# Patient Record
Sex: Male | Born: 2000 | Race: Black or African American | Hispanic: No | Marital: Single
Health system: Southern US, Community
[De-identification: ages and names within clinical notes are randomized; demographics above are authoritative.]

## PROBLEM LIST (undated history)

## (undated) DIAGNOSIS — J45909 Unspecified asthma, uncomplicated: Secondary | ICD-10-CM

## (undated) DIAGNOSIS — R059 Cough, unspecified: Secondary | ICD-10-CM

---
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
## 2020-03-30 ENCOUNTER — Ambulatory Visit: Payer: Self-pay | Attending: Internal Medicine

## 2020-03-30 DIAGNOSIS — Z23 Encounter for immunization: Secondary | ICD-10-CM

## 2020-03-30 NOTE — Progress Notes (Signed)
   Covid-19 Vaccination Clinic  Name:  Javier Nelson    MRN: [id number] DOB: 09-Dec-2000  03/30/2020  Javier Nelson was observed post Covid-19 immunization for 15 minutes without incident. He was provided with Vaccine Information Sheet and instruction to access the V-Safe system.   Javier Nelson was instructed to call 911 with any severe reactions post vaccine: Marland Kitchen Difficulty breathing  . Swelling of face and throat  . A fast heartbeat  . A bad rash all over body  . Dizziness and weakness   Immunizations Administered    Name Date Dose VIS Date Route   PFIZER Comrnaty(Gray TOP) Covid-19 Vaccine 03/30/2020  3:45 PM 0.3 mL 01/22/2020 Intramuscular   Manufacturer: [employer], [employer]   Lot: [id number]   NDC: [phone]

## 2020-05-31 ENCOUNTER — Other Ambulatory Visit: Payer: Self-pay

## 2020-05-31 ENCOUNTER — Emergency Department (HOSPITAL_COMMUNITY)

## 2020-05-31 ENCOUNTER — Emergency Department (HOSPITAL_COMMUNITY)
Admission: EM | Admit: 2020-05-31 | Discharge: 2020-05-31 | Disposition: A | Discharge status: Home / Self Care | Attending: Emergency Medicine | Admitting: Emergency Medicine

## 2020-05-31 ENCOUNTER — Encounter (HOSPITAL_COMMUNITY): Payer: Self-pay | Admitting: Emergency Medicine

## 2020-05-31 DIAGNOSIS — Z20822 Contact with and (suspected) exposure to covid-19: Secondary | ICD-10-CM | POA: Insufficient documentation

## 2020-05-31 DIAGNOSIS — J45909 Unspecified asthma, uncomplicated: Secondary | ICD-10-CM | POA: Insufficient documentation

## 2020-05-31 DIAGNOSIS — J101 Influenza due to other identified influenza virus with other respiratory manifestations: Secondary | ICD-10-CM | POA: Diagnosis not present

## 2020-05-31 DIAGNOSIS — R059 Cough, unspecified: Secondary | ICD-10-CM | POA: Diagnosis present

## 2020-05-31 HISTORY — DX: Unspecified asthma, uncomplicated: J45.909

## 2020-05-31 LAB — RESP PANEL BY RT-PCR (FLU A&B, COVID) ARPGX2
Influenza A by PCR: POSITIVE — AB
Influenza B by PCR: NEGATIVE
SARS Coronavirus 2 by RT PCR: NEGATIVE

## 2020-05-31 MED ORDER — ALBUTEROL SULFATE HFA 108 (90 BASE) MCG/ACT IN AERS: 2.0000 | INHALATION_SPRAY | Freq: Once | RESPIRATORY_TRACT | Status: DC

## 2020-05-31 NOTE — ED Triage Notes (Signed)
Pt c/o cough, runny nose, sore throat and head aches x 2 days. Pt requesting covid testing.

## 2020-05-31 NOTE — ED Triage Notes (Signed)
Emergency Medicine Provider Triage Evaluation Note  Javier Nelson , a 20 y.o. male  was evaluated in triage.  Pt complains of cough, ha, sore throat. Denies fever. Asking for refill of albuterol  Review of Systems  Positive: Rhinorrhea, cough, sore throat, headaches Negative: fever  Physical Exam  BP 130/82 (BP Location: Right Arm)   Pulse 96   Temp 98.3 F (36.8 C) (Oral)   Resp (!) 22   SpO2 97%  Gen:   Awake, no distress   HEENT:  Atraumatic, no tonsillar exudate Resp:  Normal effort  Cardiac:  Normal rate  Abd:   Nondistended, nontender  MSK:   Moves extremities without difficulty  Neuro:  Speech clear   Medical Decision Making  Medically screening exam initiated at 8:09 PM.  Appropriate orders placed.  Javier Nelson was informed that the remainder of the evaluation will be completed by another provider, this initial triage assessment does not replace that evaluation, and the importance of remaining in the ED until their evaluation is complete.  Clinical Impression   20 y/o M presenting with URI sxs.   MSE was initiated and I personally evaluated the patient and placed orders (if any) at  8:09 PM on May 31, 2020.  The patient appears stable so that the remainder of the MSE may be completed by another provider.    Karrie Meres, [place] 05/31/20 2009

## 2020-05-31 NOTE — Discharge Instructions (Signed)
Tested positive for influenza A

## 2020-05-31 NOTE — ED Notes (Signed)
RN reviewed discharge instruction w/ pt. Follow up care and pain management reviewed, pt had no further questions

## 2020-05-31 NOTE — ED Provider Notes (Signed)
[facility] EMERGENCY DEPARTMENT Provider Note   CSN: [id number] Arrival date & time: 05/31/20  1820     History Chief Complaint  Patient presents with  . Cough    Javier Nelson is a 20 y.o. male.  The history is provided by the patient.  Cough Cough characteristics:  Non-productive Sputum characteristics:  Nondescript Severity:  Moderate Onset quality:  Gradual Timing:  Intermittent Progression:  Waxing and waning Chronicity:  New Relieved by:  Nothing Worsened by:  Nothing Associated symptoms: no chest pain, no fever, no shortness of breath and no sore throat        Past Medical History:  Diagnosis Date  . Asthma     There are no problems to display for this patient.   History reviewed. No pertinent surgical history.     No family history on file.  Social History   Substance Use Topics  . Alcohol use: Never  . Drug use: Never    Home Medications Prior to Admission medications   Not on File    Allergies    Amoxicillin  Review of Systems   Review of Systems  Constitutional: Negative for fever.  HENT: Negative for congestion and sore throat.   Respiratory: Positive for cough. Negative for shortness of breath.   Cardiovascular: Negative for chest pain.    Physical Exam Updated Vital Signs BP 139/89 (BP Location: Right Arm)   Pulse 89   Temp 100.1 F (37.8 C) (Oral)   Resp 20   Ht 5\' 10"  (1.778 m)   Wt 59 kg   SpO2 99%   BMI 18.65 kg/m   Physical Exam Constitutional:      General: He is not in acute distress. HENT:     Head: Normocephalic.     Nose: Nose normal.     Mouth/Throat:     Mouth: Mucous membranes are moist.  Cardiovascular:     Pulses: Normal pulses.  Pulmonary:     Effort: Pulmonary effort is normal.  Neurological:     General: No focal deficit present.     Mental Status: He is alert.     ED Results / Procedures / Treatments   Labs (all labs ordered are listed, but only abnormal results  are displayed) Labs Reviewed  RESP PANEL BY RT-PCR (FLU A&B, COVID) ARPGX2 - Abnormal; Notable for the following components:      Result Value   Influenza A by PCR POSITIVE (*)    All other components within normal limits    EKG None  Radiology DG Chest 2 View  Result Date: 05/31/2020 CLINICAL DATA:  Cough, nasal congestion EXAM: CHEST - 2 VIEW COMPARISON:  None. FINDINGS: The heart size and mediastinal contours are within normal limits. Both lungs are clear. The visualized skeletal structures are unremarkable. IMPRESSION: Negative. Electronically Signed   By: 06/02/2020 M.D.   On: 05/31/2020 21:23    Procedures Procedures   Medications Ordered in ED Medications  albuterol (VENTOLIN HFA) 108 (90 Base) MCG/ACT inhaler 2 puff (has no administration in time range)    ED Course  I have reviewed the triage vital signs and the nursing notes.  Pertinent labs & imaging results that were available during my care of the patient were reviewed by me and considered in my medical decision making (see chart for details).    MDM Rules/Calculators/A&P  Javier Nelson is here with cough.  Chest x-ray negative for pneumonia.  Influenza A is positive.  Normal vitals.  No respiratory distress.  Overall very well-appearing.  Discharged in good condition.  Understands return precautions.  About 2 to 3 days into symptoms.  Decided against Tamiflu.  This chart was dictated using voice recognition software.  Despite best efforts to proofread,  errors can occur which can change the documentation meaning.   Final Clinical Impression(s) / ED Diagnoses Final diagnoses:  Influenza A    Rx / DC Orders ED Discharge Orders    None       Virgina Norfolk, DO 05/31/20 2232

## 2022-03-26 IMAGING — CR DG CHEST 2V
2 series · 2 of 2 positions shown · non-contrast
Comparison: None.

CLINICAL DATA: Cough, nasal congestion

EXAM:
CHEST - 2 VIEW

[chest pa]
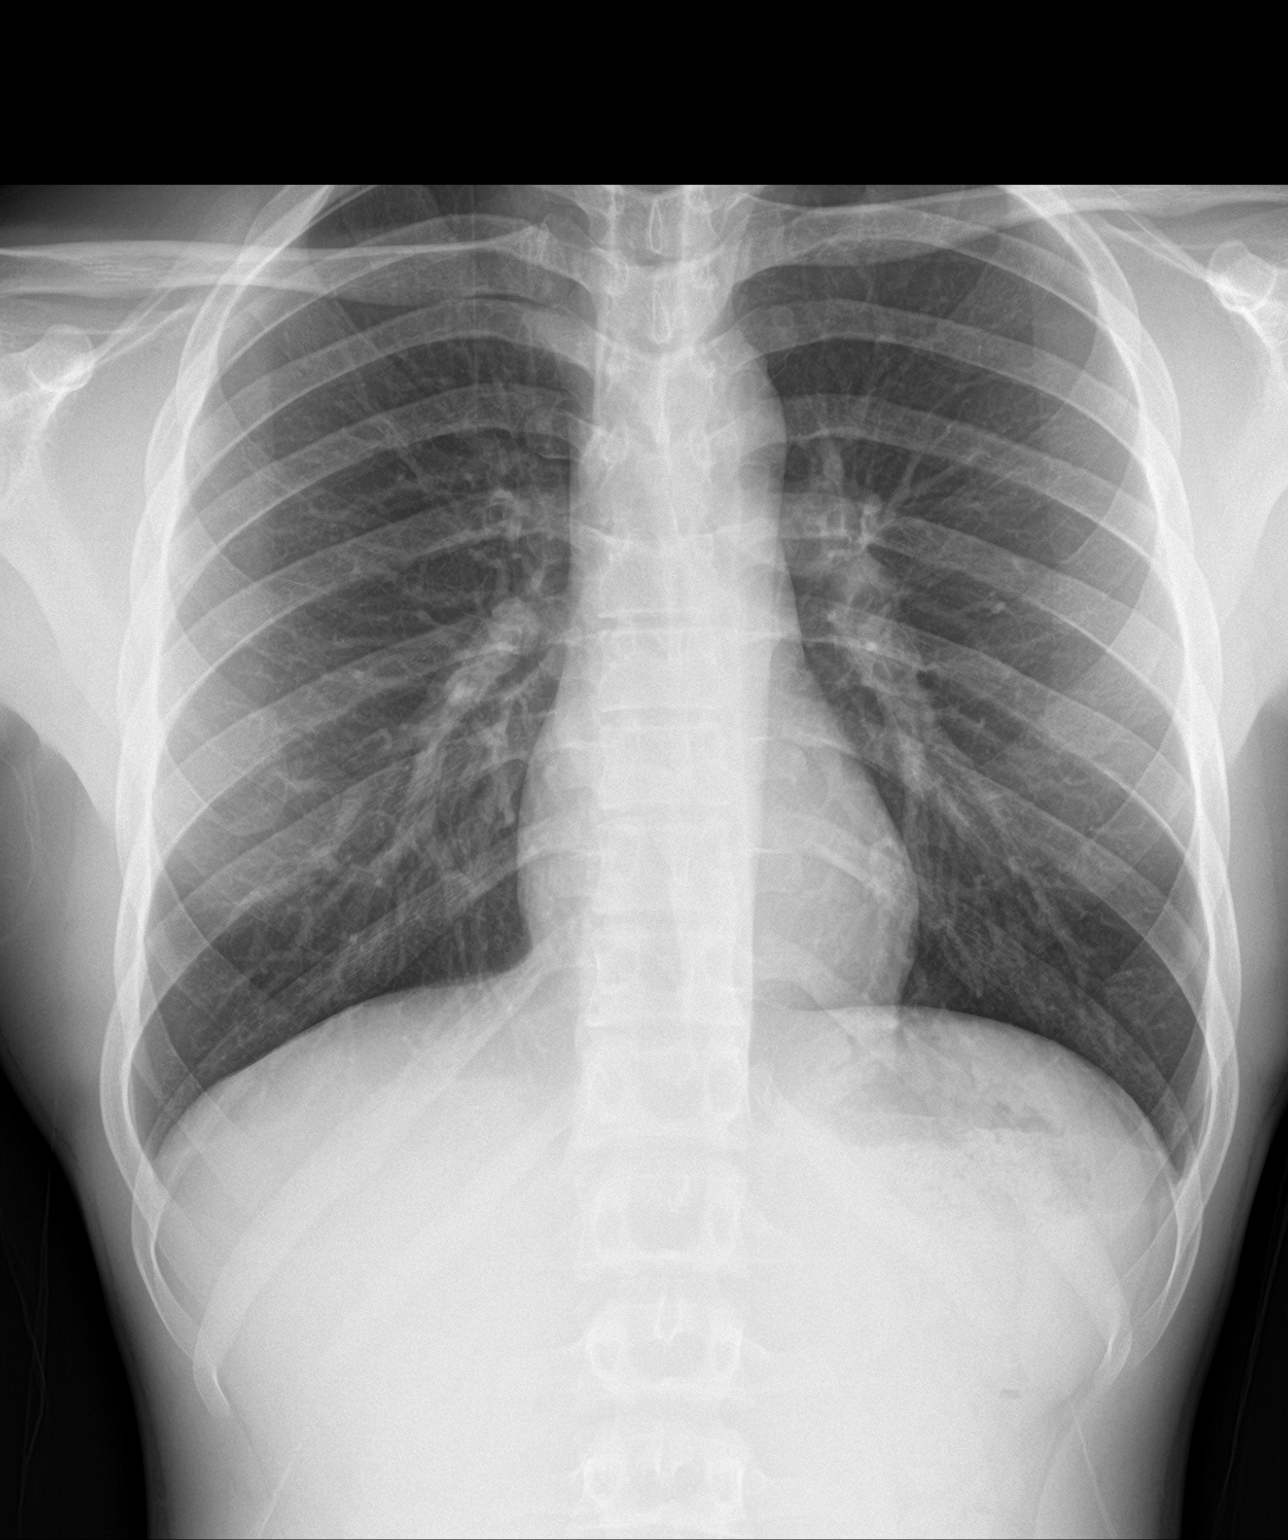

[chest lat]
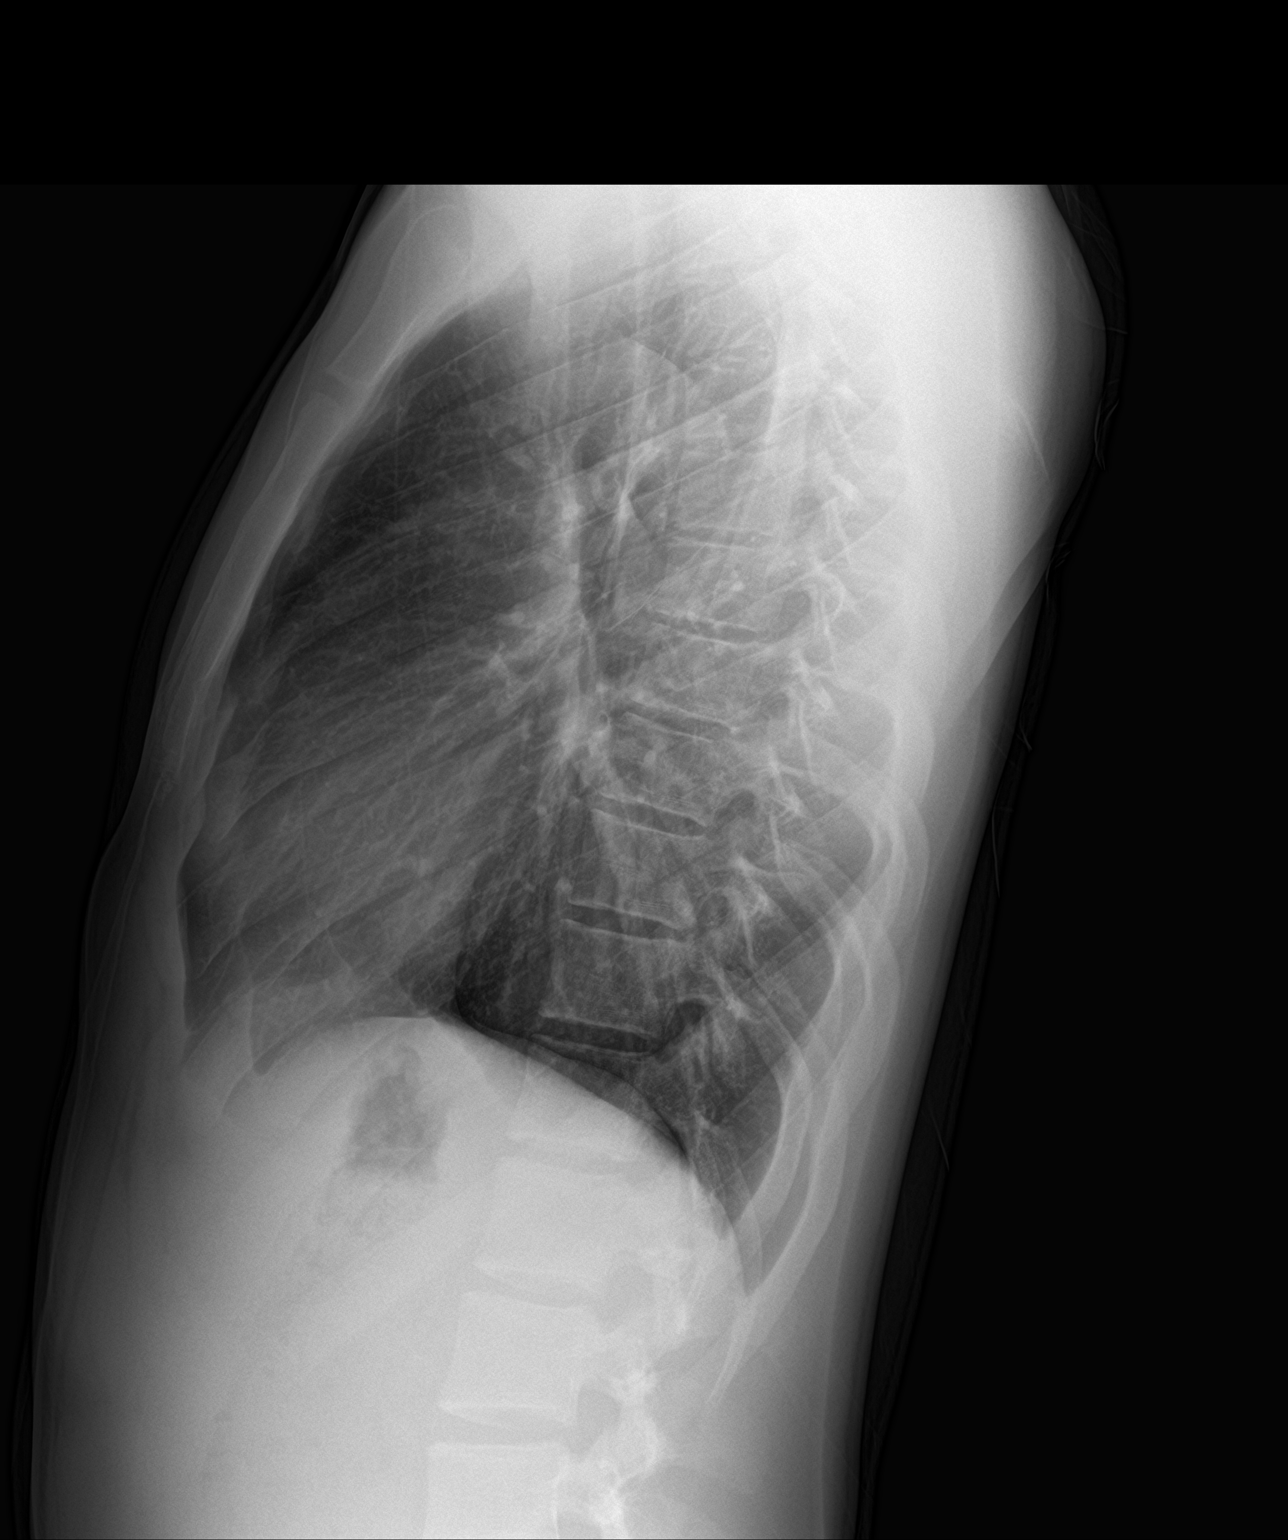

[2 of 2 positions shown; findings below may reference images not displayed]

FINDINGS: The heart size and mediastinal contours are within normal limits.
Both lungs are clear. The visualized skeletal structures are
unremarkable.
IMPRESSION: Negative.
